# Patient Record
Sex: Female | Born: 1975 | Race: Black or African American | Hispanic: No | Marital: Single | Smoking: Never smoker
Health system: Southern US, Community
[De-identification: ages and names within clinical notes are randomized; demographics above are authoritative.]

## PROBLEM LIST (undated history)

## (undated) DIAGNOSIS — I10 Essential (primary) hypertension: Secondary | ICD-10-CM

## (undated) DIAGNOSIS — E119 Type 2 diabetes mellitus without complications: Secondary | ICD-10-CM

## (undated) DIAGNOSIS — D219 Benign neoplasm of connective and other soft tissue, unspecified: Secondary | ICD-10-CM

## (undated) DIAGNOSIS — D649 Anemia, unspecified: Secondary | ICD-10-CM

## (undated) HISTORY — PX: OTHER SURGICAL HISTORY: SHX169

## (undated) HISTORY — PX: BREAST LUMPECTOMY: SHX2

---

## 2019-02-12 ENCOUNTER — Emergency Department (HOSPITAL_COMMUNITY)
Admission: EM | Admit: 2019-02-12 | Discharge: 2019-02-12 | Disposition: A | Discharge status: Home / Self Care | Payer: Medicaid - Out of State | Attending: Emergency Medicine | Admitting: Emergency Medicine

## 2019-02-12 ENCOUNTER — Emergency Department (HOSPITAL_COMMUNITY): Payer: Medicaid - Out of State

## 2019-02-12 ENCOUNTER — Encounter (HOSPITAL_COMMUNITY): Payer: Self-pay | Admitting: *Deleted

## 2019-02-12 ENCOUNTER — Other Ambulatory Visit: Payer: Self-pay

## 2019-02-12 DIAGNOSIS — D649 Anemia, unspecified: Secondary | ICD-10-CM | POA: Diagnosis not present

## 2019-02-12 DIAGNOSIS — R102 Pelvic and perineal pain: Secondary | ICD-10-CM

## 2019-02-12 DIAGNOSIS — I10 Essential (primary) hypertension: Secondary | ICD-10-CM | POA: Diagnosis not present

## 2019-02-12 DIAGNOSIS — E119 Type 2 diabetes mellitus without complications: Secondary | ICD-10-CM | POA: Diagnosis not present

## 2019-02-12 DIAGNOSIS — R1031 Right lower quadrant pain: Secondary | ICD-10-CM | POA: Diagnosis present

## 2019-02-12 HISTORY — DX: Type 2 diabetes mellitus without complications: E11.9

## 2019-02-12 HISTORY — DX: Anemia, unspecified: D64.9

## 2019-02-12 HISTORY — DX: Essential (primary) hypertension: I10

## 2019-02-12 LAB — COMPREHENSIVE METABOLIC PANEL
ALT: 18 U/L (ref 0–44)
AST: 17 U/L (ref 15–41)
Albumin: 4.1 g/dL (ref 3.5–5.0)
Alkaline Phosphatase: 74 U/L (ref 38–126)
Anion gap: 9 (ref 5–15)
BUN: 9 mg/dL (ref 6–20)
CO2: 23 mmol/L (ref 22–32)
Calcium: 9.1 mg/dL (ref 8.9–10.3)
Chloride: 105 mmol/L (ref 98–111)
Creatinine, Ser: 0.7 mg/dL (ref 0.44–1.00)
GFR calc Af Amer: >60 mL/min (ref >60)
GFR calc non Af Amer: >60 mL/min (ref >60)
Glucose, Bld: 351 mg/dL — ABNORMAL HIGH (ref 70–99)
Potassium: 3.7 mmol/L (ref 3.5–5.1)
Sodium: 137 mmol/L (ref 135–145)
Total Bilirubin: 0.4 mg/dL (ref 0.3–1.2)
Total Protein: 7.9 g/dL (ref 6.5–8.1)

## 2019-02-12 LAB — URINALYSIS, ROUTINE W REFLEX MICROSCOPIC
BILIRUBIN URINE: NEGATIVE
Bacteria, UA: NONE SEEN
Glucose, UA: >=500 mg/dL — AB
Ketones, ur: 5 mg/dL — AB
Leukocytes,Ua: NEGATIVE
Nitrite: NEGATIVE
PH: 5 (ref 5.0–8.0)
Protein, ur: NEGATIVE mg/dL
Specific Gravity, Urine: 1.038 — ABNORMAL HIGH (ref 1.005–1.030)

## 2019-02-12 LAB — WET PREP, GENITAL
Clue Cells Wet Prep HPF POC: NONE SEEN
Sperm: NONE SEEN
Trich, Wet Prep: NONE SEEN
Yeast Wet Prep HPF POC: NONE SEEN

## 2019-02-12 LAB — I-STAT BETA HCG BLOOD, ED (MC, WL, AP ONLY)

## 2019-02-12 LAB — CBC
HCT: 33.9 % — ABNORMAL LOW (ref 36.0–46.0)
Hemoglobin: 10 g/dL — ABNORMAL LOW (ref 12.0–15.0)
MCH: 25.7 pg — ABNORMAL LOW (ref 26.0–34.0)
MCHC: 29.5 g/dL — ABNORMAL LOW (ref 30.0–36.0)
MCV: 87.1 fL (ref 80.0–100.0)
Platelets: 402 10*3/uL — ABNORMAL HIGH (ref 150–400)
RBC: 3.89 MIL/uL (ref 3.87–5.11)
RDW: 13.9 % (ref 11.5–15.5)
WBC: 10.3 10*3/uL (ref 4.0–10.5)
nRBC: 0 % (ref 0.0–0.2)

## 2019-02-12 LAB — LIPASE, BLOOD: Lipase: 42 U/L (ref 11–51)

## 2019-02-12 MED ORDER — ONDANSETRON HCL 4 MG/2ML IJ SOLN
4.0000 mg | Freq: Once | INTRAMUSCULAR | Status: AC
  Administered 2019-02-12: 4 mg via INTRAVENOUS
  Filled 2019-02-12: qty 2

## 2019-02-12 MED ORDER — SODIUM CHLORIDE 0.9% FLUSH
3.0000 mL | Freq: Once | INTRAVENOUS | Status: AC
  Administered 2019-02-12: 3 mL via INTRAVENOUS

## 2019-02-12 MED ORDER — SODIUM CHLORIDE 0.9 % IV BOLUS
1000.0000 mL | Freq: Once | INTRAVENOUS | Status: AC
  Administered 2019-02-12: 1000 mL via INTRAVENOUS

## 2019-02-12 MED ORDER — NAPROXEN 500 MG PO TABS: 500.0000 mg | ORAL_TABLET | Freq: Two times a day (BID) | ORAL | 0 refills | Status: AC

## 2019-02-12 MED ORDER — ONDANSETRON 4 MG PO TBDP: 4.0000 mg | ORAL_TABLET | Freq: Three times a day (TID) | ORAL | 0 refills | Status: AC | PRN

## 2019-02-12 MED ORDER — PROMETHAZINE HCL 25 MG/ML IJ SOLN
25.0000 mg | Freq: Once | INTRAMUSCULAR | Status: AC
  Administered 2019-02-12: 25 mg via INTRAVENOUS
  Filled 2019-02-12: qty 1

## 2019-02-12 MED ORDER — HYDROMORPHONE HCL 1 MG/ML IJ SOLN
1.0000 mg | Freq: Once | INTRAMUSCULAR | Status: AC
  Administered 2019-02-12: 1 mg via INTRAVENOUS
  Filled 2019-02-12: qty 1

## 2019-02-12 NOTE — ED Triage Notes (Signed)
Pt stated "I have fibroids but for the past days I've been having worse pain on the right side.  I feel weak.  I'm from [place] but down here visiting for the next couple of weeks."  Pt c/o nausea; denies vomiting.

## 2019-02-12 NOTE — ED Notes (Signed)
US at bedside

## 2019-02-12 NOTE — ED Notes (Signed)
Patient given diet ginger ale for fluid challenge. Pt states she still feels nauseated but is able to keep it down.

## 2019-02-12 NOTE — ED Provider Notes (Signed)
Physical Exam  BP 129/79 (BP Location: Left Arm)   Pulse 74   Temp 98.1 F (36.7 C) (Oral)   Resp 15   Ht 5\' 5"  (1.651 m)   Wt 95.3 kg   LMP 01/15/2019 (Approximate)   SpO2 100%   BMI 34.95 kg/m   Assumed care from [employer], PA-C at 0700. Briefly, the patient is a 43 y.o. female with PMHx of  has a past medical history of Anemia, Diabetes mellitus without complication ([place]), and Hypertension. here with pelvic and flank pain.  Patient has a known history of multiple fibroids as well as pelvic pain, however reports it is worse today.  Patient has been bleeding daily since February 1.  She typically experiences menses every 28 days with intermenstrual bleeding since September 2019.  Labs Reviewed  WET PREP, GENITAL - Abnormal; Notable for the following components:      Result Value   WBC, Wet Prep HPF POC FEW (*)    All other components within normal limits  COMPREHENSIVE METABOLIC PANEL - Abnormal; Notable for the following components:   Glucose, Bld 351 (*)    All other components within normal limits  CBC - Abnormal; Notable for the following components:   Hemoglobin 10.0 (*)    HCT 33.9 (*)    MCH 25.7 (*)    MCHC 29.5 (*)    Platelets 402 (*)    All other components within normal limits  URINALYSIS, ROUTINE W REFLEX MICROSCOPIC - Abnormal; Notable for the following components:   Color, Urine AMBER (*)    APPearance CLOUDY (*)    Specific Gravity, Urine 1.038 (*)    Glucose, UA >=500 (*)    Hgb urine dipstick MODERATE (*)    Ketones, ur 5 (*)    All other components within normal limits  LIPASE, BLOOD  I-STAT BETA HCG BLOOD, ED ([facility], [facility], [facility] ONLY)  I-STAT BETA HCG BLOOD, ED ([facility], [facility], [facility] ONLY)  GC/CHLAMYDIA PROBE AMP ([place]) NOT AT [facility]    Course of Care:   Physical Exam Vitals signs and nursing note reviewed.  Constitutional:      General: She is not in acute distress.    Appearance: She is well-developed. She is not diaphoretic.     Comments: Sitting  comfortably in bed.  HENT:     Head: Normocephalic and atraumatic.  Eyes:     General:        Right eye: No discharge.        Left eye: No discharge.     Conjunctiva/sclera: Conjunctivae normal.     Comments: EOMs normal to gross examination.  Neck:     Musculoskeletal: Normal range of motion.  Cardiovascular:     Rate and Rhythm: Normal rate and regular rhythm.     Comments: Intact, 2+ radial pulse. Abdominal:     General: There is no distension.     Tenderness: There is abdominal tenderness in the right lower quadrant and left lower quadrant.     Comments: Mild lower abdominal pain w/o guarding or rebound.   Musculoskeletal: Normal range of motion.  Skin:    General: Skin is warm and dry.  Neurological:     Mental Status: She is alert.     Comments: Cranial nerves intact to gross observation. Patient moves extremities without difficulty.  Psychiatric:        Behavior: Behavior normal.        Thought Content: Thought content normal.  Judgment: Judgment normal.     ED Course/Procedures     Procedures  MDM   9:00 AM On my exam, patient is comfortable, resting, and in no distress.  Abdomen is nonsurgical.  Provided patient passes p.o. challenge, patient stable for discharge.  No active vomiting currently.  Work-up demonstrating no leukocytosis.  Hemoglobin 10.0, approximately one-point lower in the past month.  Likely secondary to intermenstrual bleeding.  No electrolyte abnormalities.  Urinalysis demonstrating moderate hemoglobinuria, consistent with report of vaginal bleeding.  Wet prep with few WBCs, not consistent with STI.  CT renal stone study was without abnormality.  Normal appendix.  Patient tolerating p.o.  Patient follow-up with her OB/GYN later this week.  Return precautions given for any worsening pain or intractable nausea or vomiting.  Patient is in understanding and agrees with the plan of care.    Albesa Seen, PA-C 02/12/19 0945    Fatima Blank, MD 02/13/19 [phone]

## 2019-02-12 NOTE — ED Provider Notes (Signed)
[place] DEPT Provider Note   CSN: [id number] Arrival date & time: 02/12/19  0136    History   Chief Complaint Chief Complaint  Patient presents with  . Abdominal Pain    HPI Karley Pho is a 43 y.o. female with a h/o of IDDM T2, HTN, and iron deficiency anemia secondary to uterine fibroids who presents to the emergency department with a chief complaint of right lower quadrant pain.  The patient endorses gradual onset, constant right lower quadrant pain that is been worsening since onset 2 days ago.  States the pain is throbbing and worse with movement.  Pain is improved when she crawls up into the fetal position and does not move.  She states "it feels like there is a ball inside of me." She reports associated right flank pain, nausea, and nonbloody, nonbilious emesis.  She reports associated generalized weakness and fatigue.  She also reports that she has had vaginal bleeding since February 1.  She states this is not uncommon for her to bleed for several weeks due to her uterine fibroids.  She is currently established with OB/GYN for uterine fibroids.  She reports that she had to stop wearing pads because she was developing a rash from wearing them so frequently.  She denies fever, chills, vaginal discharge, vaginal pain or itching, dysuria, hematuria, chest pain, shortness of breath.  Reports that she is not currently sexually active and has not been sexually active in the last 6 months.  She is currently on the Depo.  She reports that she takes Colace daily for constipation.     The history is provided by the patient. No language interpreter was used.    Past Medical History:  Diagnosis Date  . Anemia   . Diabetes mellitus without complication ([place])   . Hypertension     There are no active problems to display for this patient.   Past Surgical History:  Procedure Laterality Date  . BREAST LUMPECTOMY     right  . tubal ligation        OB History   No obstetric history on file.      Home Medications    Prior to Admission medications   Not on File    Family History No family history on file.  Social History Social History   Tobacco Use  . Smoking status: Never Smoker  . Smokeless tobacco: Never Used  Substance Use Topics  . Alcohol use: Not Currently    Frequency: Never  . Drug use: Never     Allergies   Patient has no allergy information on record.   Review of Systems Review of Systems  Constitutional: Negative for activity change, chills and fever.  Eyes: Negative for visual disturbance.  Respiratory: Negative for shortness of breath.   Cardiovascular: Negative for chest pain.  Gastrointestinal: Positive for abdominal pain, nausea and vomiting. Negative for constipation and diarrhea.  Genitourinary: Positive for flank pain, menstrual problem, pelvic pain and vaginal bleeding. Negative for decreased urine volume, dysuria, hematuria, vaginal discharge and vaginal pain.  Musculoskeletal: Negative for back pain, myalgias, neck pain and neck stiffness.  Skin: Negative for rash.  Allergic/Immunologic: Negative for immunocompromised state.  Neurological: Negative for weakness and headaches.  Psychiatric/Behavioral: Negative for confusion.     Physical Exam Updated Vital Signs BP 129/79 (BP Location: Left Arm)   Pulse 74   Temp 98.1 F (36.7 C) (Oral)   Resp 15   Ht 5\' 5"  (1.651 m)  Wt 95.3 kg   LMP 01/15/2019 (Approximate)   SpO2 100%   BMI 34.95 kg/m   Physical Exam Vitals signs and nursing note reviewed.  Constitutional:      General: She is not in acute distress. HENT:     Head: Normocephalic.  Eyes:     Conjunctiva/sclera: Conjunctivae normal.  Neck:     Musculoskeletal: Neck supple.  Cardiovascular:     Rate and Rhythm: Normal rate and regular rhythm.     Pulses: Normal pulses.     Heart sounds: Normal heart sounds. No murmur. No friction rub. No gallop.    Pulmonary:     Effort: Pulmonary effort is normal. No respiratory distress.     Breath sounds: No stridor. No wheezing, rhonchi or rales.  Chest:     Chest wall: No tenderness.  Abdominal:     General: There is no distension.     Palpations: Abdomen is soft. There is no shifting dullness.     Tenderness: There is abdominal tenderness. There is right CVA tenderness and guarding. There is no left CVA tenderness or rebound.     Comments: Diffusely tender to palpation throughout the entire abdomen with maximal tenderness to palpation with guarding in the right lower quadrant.  No tenderness over McBurney's point.  She has right CVA tenderness.  No left CVA tenderness.  Negative Murphy sign.  No rebound.  Abdomen is soft, nondistended.  Genitourinary:    Comments: Chaperoned exam.  Diffuse adnexal tenderness bilaterally.  No masses bilaterally.  She has cervical motion tenderness.  No chandelier sign.  There is a scant amount of white discharge in the vaginal vault. Musculoskeletal:     Right lower leg: No edema.     Left lower leg: No edema.  Skin:    General: Skin is warm.     Findings: No rash.  Neurological:     Mental Status: She is alert.  Psychiatric:        Behavior: Behavior normal.      ED Treatments / Results  Labs (all labs ordered are listed, but only abnormal results are displayed) Labs Reviewed  WET PREP, GENITAL - Abnormal; Notable for the following components:      Result Value   WBC, Wet Prep HPF POC FEW (*)    All other components within normal limits  COMPREHENSIVE METABOLIC PANEL - Abnormal; Notable for the following components:   Glucose, Bld 351 (*)    All other components within normal limits  CBC - Abnormal; Notable for the following components:   Hemoglobin 10.0 (*)    HCT 33.9 (*)    MCH 25.7 (*)    MCHC 29.5 (*)    Platelets 402 (*)    All other components within normal limits  URINALYSIS, ROUTINE W REFLEX MICROSCOPIC - Abnormal; Notable for the  following components:   Color, Urine AMBER (*)    APPearance CLOUDY (*)    Specific Gravity, Urine 1.038 (*)    Glucose, UA >=500 (*)    Hgb urine dipstick MODERATE (*)    Ketones, ur 5 (*)    All other components within normal limits  LIPASE, BLOOD  I-STAT BETA HCG BLOOD, ED ([facility], [facility], [facility] ONLY)  I-STAT BETA HCG BLOOD, ED ([facility], [facility], [facility] ONLY)  GC/CHLAMYDIA PROBE AMP ([place]) NOT AT [facility]    EKG None  Radiology Ct Renal Stone Study  Result Date: 02/12/2019 CLINICAL DATA:  43 year old female with flank pain. Concern for kidney stone. EXAM:  CT ABDOMEN AND PELVIS WITHOUT CONTRAST TECHNIQUE: Multidetector CT imaging of the abdomen and pelvis was performed following the standard protocol without IV contrast. COMPARISON:  None. FINDINGS: Evaluation of this exam is limited in the absence of intravenous contrast. Lower chest: The visualized lung bases are clear. No intra-abdominal free air or free fluid. Hepatobiliary: No focal liver abnormality is seen. No gallstones, gallbladder wall thickening, or biliary dilatation. Pancreas: Unremarkable. No pancreatic ductal dilatation or surrounding inflammatory changes. Spleen: Normal in size without focal abnormality. Adrenals/Urinary Tract: Adrenal glands are unremarkable. Kidneys are normal, without renal calculi, focal lesion, or hydronephrosis. Bladder is unremarkable. Stomach/Bowel: There is large amount of stool throughout the colon. No bowel obstruction or active inflammation. Normal appendix. Vascular/Lymphatic: The abdominal aorta and IVC are grossly unremarkable on this noncontrast CT. No portal venous gas. There is no adenopathy. Reproductive: Enlarged myomatous uterus. Other: None Musculoskeletal: No acute or significant osseous findings. IMPRESSION: 1. No acute intra-abdominal or pelvic pathology. No hydronephrosis or nephrolithiasis. 2. Constipation. No bowel obstruction or active inflammation. Normal appendix. 3. Enlarged myomatous uterus.  Electronically Signed   By: Anner Crete M.D.   On: 02/12/2019 05:03    Procedures Procedures (including critical care time)  Medications Ordered in ED Medications  sodium chloride flush (NS) 0.9 % injection 3 mL (3 mLs Intravenous Given 02/12/19 0751)  sodium chloride 0.9 % bolus 1,000 mL (0 mLs Intravenous Stopped 02/12/19 0531)  ondansetron (ZOFRAN) injection 4 mg (4 mg Intravenous Given 02/12/19 0401)  HYDROmorphone (DILAUDID) injection 1 mg (1 mg Intravenous Given 02/12/19 0402)  promethazine (PHENERGAN) injection 25 mg (25 mg Intravenous Given 02/12/19 0750)  sodium chloride 0.9 % bolus 1,000 mL (1,000 mLs Intravenous Bolus 02/12/19 0749)     Initial Impression / Assessment and Plan / ED Course  I have reviewed the triage vital signs and the nursing notes.  Pertinent labs & imaging results that were available during my care of the patient were reviewed by me and considered in my medical decision making (see chart for details).        43 year old female with a h/o of IDDM T2, HTN, and iron deficiency anemia secondary to uterine fibroids presenting with right lower quadrant pain for 2 days with nausea and vomiting.  She has a history of uterine fibroids and has had vaginal bleeding for the last month.  She is currently on the Depo-Provera injection.  She has a history of a tubal ligation.  On exam, she is tender to palpation the bilateral lower quadrants no tenderness over McBurney's point.  She appears uncomfortable.  Dilaudid given for pain control with Zofran for nausea.  She was given a fluid bolus for hyperglycemia.  Anion gap and bicarb are normal.  Hemoglobin is 10.0, down from 11.5 last month.  UA with glucosuria, mild ketonuria, and elevated specific gravity as well as RBCs and moderate hematuria.  Although hemoglobinuria is likely secondary to vaginal bleeding, given her presentation and history of present illness, will order CT stone study.  CT stone study with constipation,  but no bowel obstruction or active inflammation.  It demonstrates an enlarged myomatous uterus.  No acute intra-abdominal pelvic pathology.  No nephrolithiasis.  Discussed these findings with the patient.  Pelvic exam was form for completeness.  Wet prep with few WBCs but otherwise unremarkable.  She attempted fluid challenge, but developed vomiting so Reglan was given.  On reexamination of her abdomen, she continues to have discomfort with palpation of the bilateral lower quadrants.  Will  order pelvic ultrasound since she was diffusely tender to palpation on pelvic exam.  Low suspicion for PID, ovarian torsion, diverticulitis, appendicitis, nephrolithiasis, pancreatitis, or cholecystitis.  Patient care transferred to [place] at the end of my shift.  I suspect her symptoms may be secondary to uterine fibroids coupled with constipation.  patient presentation, ED course, and plan of care discussed with review of all pertinent labs and imaging.  Suspect she will likely be appropriate for discharge to home after work-up is complete.  Please see his/her note for further details regarding further ED course and disposition.   Final Clinical Impressions(s) / ED Diagnoses   Final diagnoses:  None    ED Discharge Orders    None       Joanne Gavel, PA-C 02/12/19 0810    Fatima Blank, MD 02/13/19 [phone]

## 2019-02-12 NOTE — ED Notes (Signed)
Bed: WA25 Expected date:  Expected time:  Means of arrival:  Comments: Triage 2 

## 2019-02-12 NOTE — ED Notes (Signed)
Pt provided with gingerale by PA

## 2019-02-12 NOTE — Discharge Instructions (Addendum)
Please see the information and instructions below regarding your visit.  Your diagnoses today include:  1. Pelvic pain   2. Anemia, unspecified type     Tests performed today include: See side panel of your discharge paperwork for testing performed today. Vital signs are listed at the bottom of these instructions.   Your work-up is very reassuring today.    Medications prescribed:    Take any prescribed medications only as prescribed, and any over the counter medications only as directed on the packaging.  You are prescribed naproxen, a non-steroidal anti-inflammatory agent (NSAID) for pain. You may take 500 mg every 12 hours as needed for pain. If still requiring this medication around the clock for acute pain after 10 days, please see your primary healthcare provider.  Women who are pregnant, breastfeeding, or planning on becoming pregnant should not take non-steroidal anti-inflammatories such as Advil and Aleve. Tylenol is a safe over the counter pain reliever in pregnant women.  You may combine this medication with Tylenol, 650 mg every 6 hours, so you are receiving something for pain every 3 hours.  This is not a long-term medication unless under the care and direction of your primary provider. Taking this medication long-term and not under the supervision of a healthcare provider could increase the risk of stomach ulcers, kidney problems, and cardiovascular problems such as high blood pressure.   Please take Zofran under the tongue every 8 hours as needed for nausea and vomiting.  Home care instructions:  Please follow any educational materials contained in this packet.   Follow-up instructions:  Please follow up with you OBGYN in the coming week for pelvic pain.  Return instructions:  Please return to the Emergency Department if you experience worsening symptoms.  Please return to the emergency department immediately if you have any worsening pain, pain in the prevention  keep anything down by mouth, or any new or worsening symptoms. Please return if you have any other emergent concerns.  Additional Information:   Your vital signs today were: BP 129/79 (BP Location: Left Arm)    Pulse 74    Temp 98.1 F (36.7 C) (Oral)    Resp 15    Ht 5\' 5"  (1.651 m)    Wt 95.3 kg    LMP 01/15/2019 (Approximate)    SpO2 100%    BMI 34.95 kg/m  If your blood pressure (BP) was elevated on multiple readings during this visit above 130 for the top number or above 80 for the bottom number, please have this repeated by your primary care provider within one month. --------------  Thank you for allowing [place] to participate in your care today.

## 2019-02-12 NOTE — ED Notes (Signed)
Pt tolerating fluids at this time.  

## 2019-02-14 LAB — GC/CHLAMYDIA PROBE AMP (~~LOC~~) NOT AT ARMC
Chlamydia: NEGATIVE
Neisseria Gonorrhea: NEGATIVE

## 2019-08-02 ENCOUNTER — Encounter (HOSPITAL_COMMUNITY): Payer: Self-pay

## 2019-08-02 ENCOUNTER — Ambulatory Visit (HOSPITAL_COMMUNITY)
Admission: EM | Admit: 2019-08-02 | Discharge: 2019-08-02 | Disposition: A | Discharge status: Home / Self Care | Payer: BLUE CROSS/BLUE SHIELD | Attending: Family Medicine | Admitting: Family Medicine

## 2019-08-02 ENCOUNTER — Other Ambulatory Visit: Payer: Self-pay

## 2019-08-02 DIAGNOSIS — R6889 Other general symptoms and signs: Secondary | ICD-10-CM | POA: Diagnosis present

## 2019-08-02 DIAGNOSIS — Z20822 Contact with and (suspected) exposure to covid-19: Secondary | ICD-10-CM

## 2019-08-02 DIAGNOSIS — Z20828 Contact with and (suspected) exposure to other viral communicable diseases: Secondary | ICD-10-CM | POA: Diagnosis not present

## 2019-08-02 DIAGNOSIS — R51 Headache: Secondary | ICD-10-CM

## 2019-08-02 DIAGNOSIS — R519 Headache, unspecified: Secondary | ICD-10-CM

## 2019-08-02 HISTORY — DX: Benign neoplasm of connective and other soft tissue, unspecified: D21.9

## 2019-08-02 NOTE — ED Provider Notes (Signed)
[facility]    CSN: [id number] Arrival date & time: 08/02/19  1056     History   Chief Complaint Chief Complaint  Patient presents with  . Headache    HPI Kimberly Parrish is a 43 y.o. female.   Patient presents with headache x3 days.  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  Her headache is generalized across the top of her head and forehead.  She denies vision changes, dizziness, palpitations, fever, chills, sore throat, chest pain, cough, shortness of breath, vomiting, diarrhea, or other symptoms.  She has taken Tylenol and ibuprofen without relief but states her headache is improved with rest.  The history is provided by the patient.    Past Medical History:  Diagnosis Date  . Anemia   . Diabetes mellitus without complication ([place])   . Fibroids   . Hypertension     There are no active problems to display for this patient.   Past Surgical History:  Procedure Laterality Date  . BREAST LUMPECTOMY     right  . tubal ligation      OB History   No obstetric history on file.      Home Medications    Prior to Admission medications   Medication Sig Start Date End Date Taking? Authorizing Provider  aspirin EC 81 MG tablet Take 81 mg by mouth daily. 01/04/19   [provider]  atorvastatin (LIPITOR) 20 MG tablet Take 20 mg by mouth daily. 12/30/18   [provider]  B Complex-C-Folic Acid (RENAL) 1 MG CAPS Take 1 capsule by mouth every morning.    [provider]  DOK 100 MG capsule Take 100 mg by mouth daily. 01/21/19   [provider]  Ferrous Sulfate (IRON) 325 (65 Fe) MG TABS Take 65 mg by mouth daily. 01/21/19   [provider]  gabapentin (NEURONTIN) 400 MG capsule Take 400 mg by mouth 3 (three) times daily. 01/21/19   [provider]  LANTUS SOLOSTAR 100 UNIT/ML Solostar Pen Inject 30 Units into the skin at bedtime.  01/27/19   [provider]  lisinopril (PRINIVIL,ZESTRIL) 20 MG tablet Take 20 mg by mouth daily. 12/30/18   [provider]  metFORMIN (GLUCOPHAGE) 1000 MG tablet Take 1,000 mg by mouth 2 (two) times daily. 01/04/19   [provider]  NOVOLOG FLEXPEN 100 UNIT/ML FlexPen Inject 20 Units into the skin 2 (two) times daily. 01/27/19   [provider]  ondansetron (ZOFRAN ODT) 4 MG disintegrating tablet Take 1 tablet (4 mg total) by mouth every 8 (eight) hours as needed for nausea or vomiting. 02/12/19   Albesa Seen, PA-C    Family History Family History  Problem Relation Age of Onset  . Diabetes Mother   . Multiple sclerosis Mother     Social History Social History   Tobacco Use  . Smoking status: Never Smoker  . Smokeless tobacco: Never Used  Substance Use Topics  . Alcohol use: Not Currently    Frequency: Never  . Drug use: Never     Allergies   Metronidazole   Review of Systems Review of Systems  Constitutional: Negative for chills and fever.  HENT: Negative for congestion, ear pain, rhinorrhea, sore throat and trouble swallowing.   Eyes: Negative for pain and visual disturbance.  Respiratory: Negative for cough and shortness of breath.   Cardiovascular: Negative for chest pain  and palpitations.  Gastrointestinal: Negative for abdominal pain, diarrhea and vomiting.  Genitourinary: Negative for dysuria and hematuria.  Musculoskeletal: Negative for arthralgias and back pain.  Skin: Negative for color change and rash.  Neurological: Positive for headaches. Negative for dizziness, tremors, seizures, syncope, facial asymmetry, speech difficulty, weakness, light-headedness and numbness.  All other systems reviewed and are negative.    Physical Exam Triage Vital Signs ED Triage Vitals  Enc Vitals Group     BP 08/02/19 1131 126/87     Pulse Rate 08/02/19 1131 90     Resp 08/02/19 1131 16     Temp 08/02/19 1131 98.5 F (36.9 C)      Temp src --      SpO2 08/02/19 1131 99 %     Weight --      Height --      Head Circumference --      Peak Flow --      Pain Score 08/02/19 1126 8     Pain Loc --      Pain Edu? --      Excl. in [place]? --    No data found.  Updated Vital Signs BP 126/87 (BP Location: Left Arm)   Pulse 90   Temp 98.5 F (36.9 C)   Resp 16   LMP 07/26/2019   SpO2 99%   Visual Acuity Right Eye Distance:   Left Eye Distance:   Bilateral Distance:    Right Eye Near:   Left Eye Near:    Bilateral Near:     Physical Exam Vitals signs and nursing note reviewed.  Constitutional:      General: She is not in acute distress.    Appearance: She is well-developed.  HENT:     Head: Normocephalic and atraumatic.     Right Ear: Tympanic membrane normal.     Left Ear: Tympanic membrane normal.     Nose: Nose normal.     Mouth/Throat:     Mouth: Mucous membranes are moist.     Pharynx: Oropharynx is clear.  Eyes:     Conjunctiva/sclera: Conjunctivae normal.  Neck:     Musculoskeletal: Neck supple.  Cardiovascular:     Rate and Rhythm: Normal rate and regular rhythm.     Heart sounds: Normal heart sounds. No murmur.  Pulmonary:     Effort: Pulmonary effort is normal. No respiratory distress.     Breath sounds: Normal breath sounds.  Abdominal:     General: Bowel sounds are normal.     Palpations: Abdomen is soft.     Tenderness: There is no abdominal tenderness. There is no guarding or rebound.  Skin:    General: Skin is warm and dry.     Findings: No rash.  Neurological:     General: No focal deficit present.     Mental Status: She is alert and oriented to person, place, and time.     Cranial Nerves: No cranial nerve deficit.     Sensory: No sensory deficit.     Motor: No weakness.     Coordination: Coordination normal.     Gait: Gait normal.     Deep Tendon Reflexes: Reflexes normal.  Psychiatric:        Mood and Affect: Mood normal.        Behavior: Behavior normal.      UC  Treatments / Results  Labs (all labs ordered are listed, but only abnormal results are displayed) Labs Reviewed  NOVEL CORONAVIRUS,  NAA (HOSPITAL ORDER, SEND-OUT TO REF LAB)    EKG   Radiology No results found.  Procedures Procedures (including critical care time)  Medications Ordered in UC Medications - No data to display  Initial Impression / Assessment and Plan / UC Course  I have reviewed the triage vital signs and the nursing notes.  Pertinent labs & imaging results that were available during my care of the patient were reviewed by me and considered in my medical decision making (see chart for details).   Headache, suspect COVID.  Patient is well-appearing and her exam is unremarkable.  COVID test performed here.  Instructed patient to self quarantine until her COVID test result is back.  Instructed patient to go to the emergency department if she develops worsening headache, vision changes, shortness of breath, high fever, severe diarrhea, or other concerning symptoms.     Final Clinical Impressions(s) / UC Diagnoses   Final diagnoses:  Acute nonintractable headache, unspecified headache type  Suspected Covid-19 Virus Infection     Discharge Instructions     Your COVID test is pending.  You should self quarantine until your test result is back and is negative.    Go to the emergency department if you develop worsening headache, vision changes, shortness of breath, high fever, severe diarrhea, or other concerning symptoms.         ED Prescriptions    None     Controlled Substance Prescriptions [place] Controlled Substance Registry consulted? Not Applicable   Sharion Balloon, NP 08/02/19 1220

## 2019-08-02 NOTE — ED Triage Notes (Addendum)
Pt presents to UC with headache for 3 days. Pt states she had exposure to a coworker tested positive for COVID. Pt states she was taking motrin and tylenol without relief.

## 2019-08-02 NOTE — Discharge Instructions (Addendum)
Your COVID test is pending.  You should self quarantine until your test result is back and is negative.    Go to the emergency department if you develop worsening headache, vision changes, shortness of breath, high fever, severe diarrhea, or other concerning symptoms.

## 2019-08-03 LAB — NOVEL CORONAVIRUS, NAA (HOSP ORDER, SEND-OUT TO REF LAB; TAT 18-24 HRS): SARS-CoV-2, NAA: NOT DETECTED

## 2020-10-03 IMAGING — US US TRANSVAGINAL NON-OB
1 series · 14 of 25 positions shown · non-contrast
Comparison: CT from earlier in the same day.

CLINICAL DATA: Vaginal bleeding for 1 month with pelvic pain



[Series 1: us transvaginal non-ob · 14 of 121 slices shown]
[im 1/121]
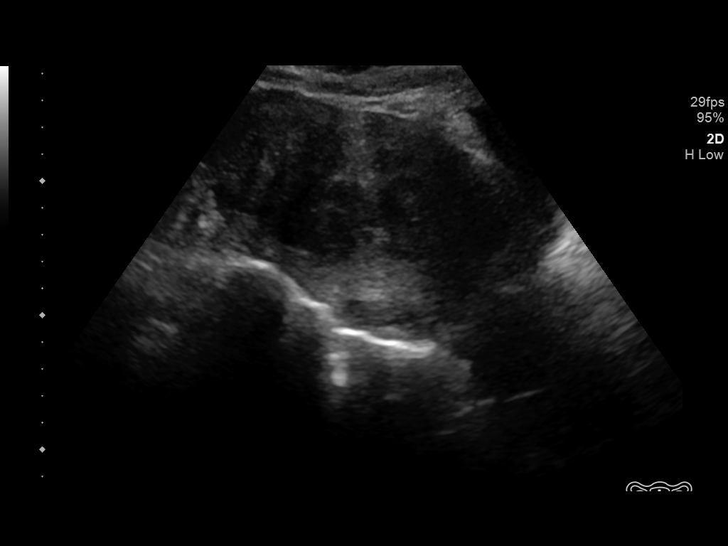
[im 11/121]
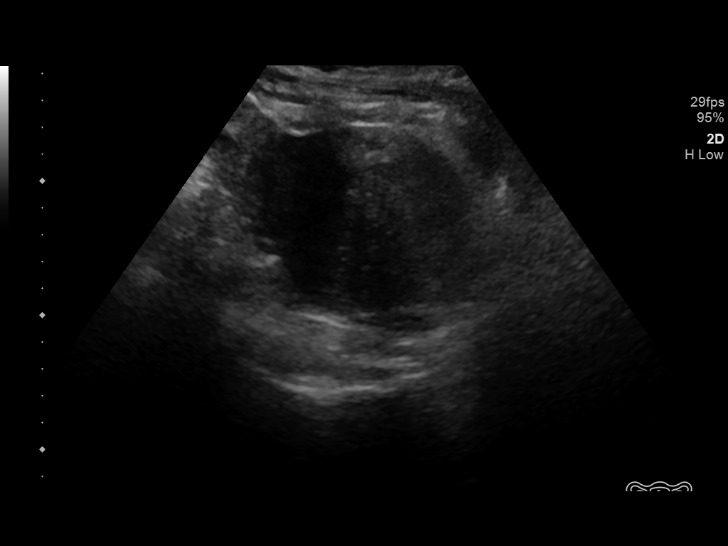
[im 21/121]
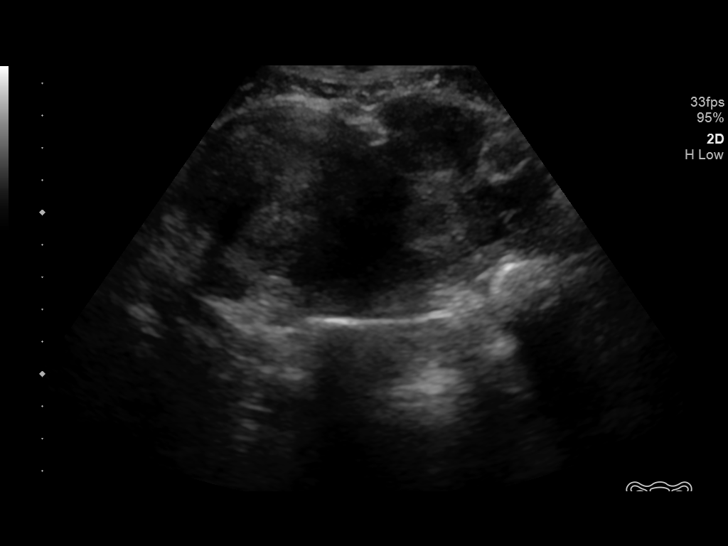
[im 31/121]
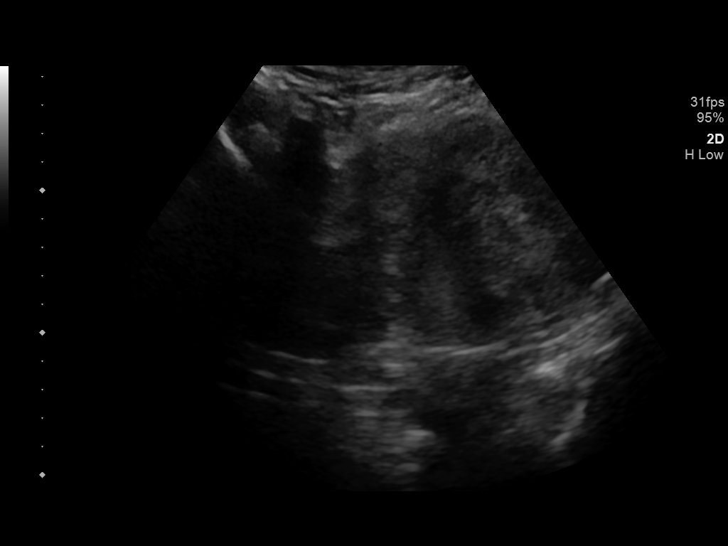
[im 41/121]
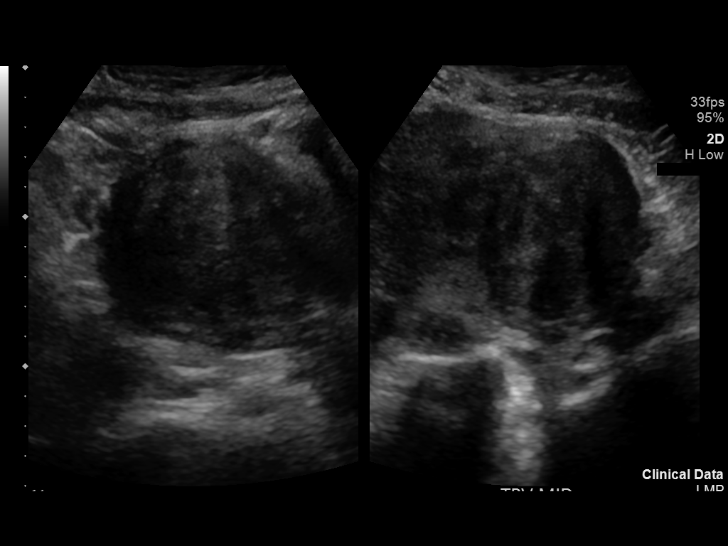
[im 46/121]
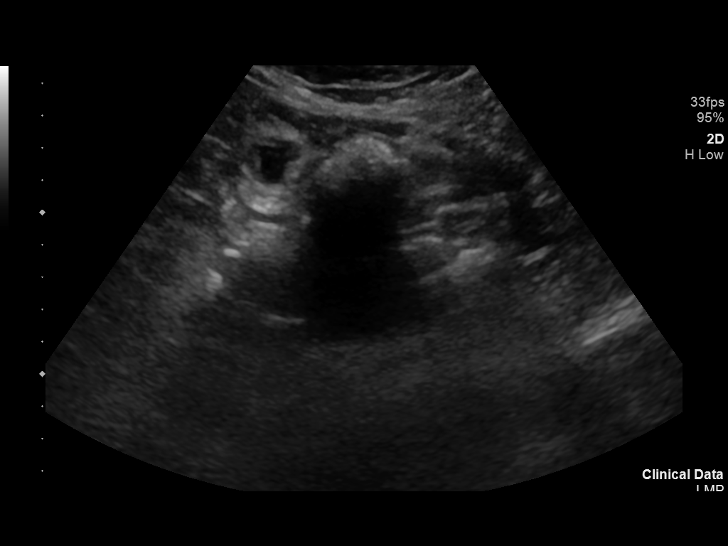
[im 56/121]
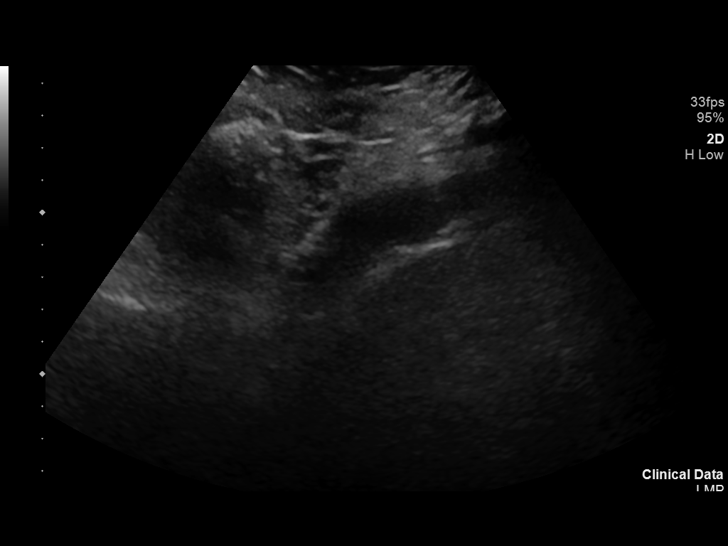
[im 66/121]
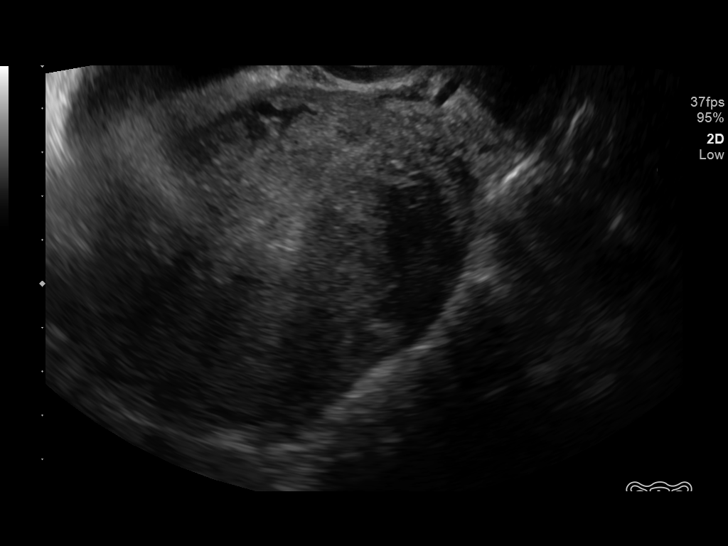
[im 76/121]
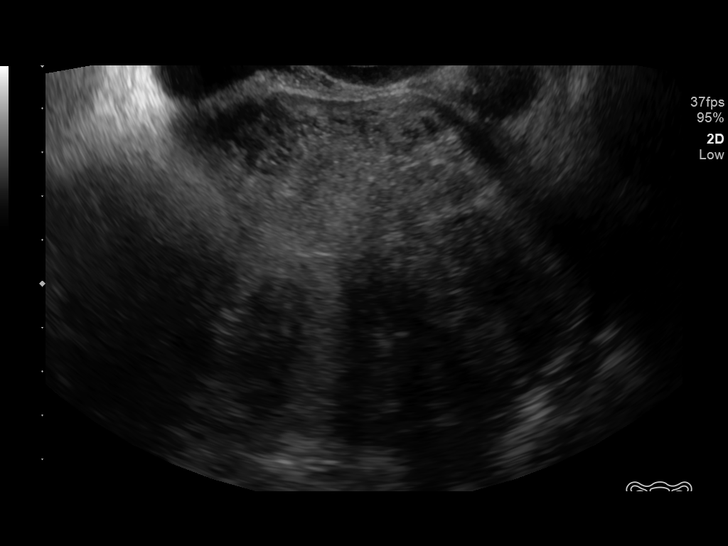
[im 81/121]
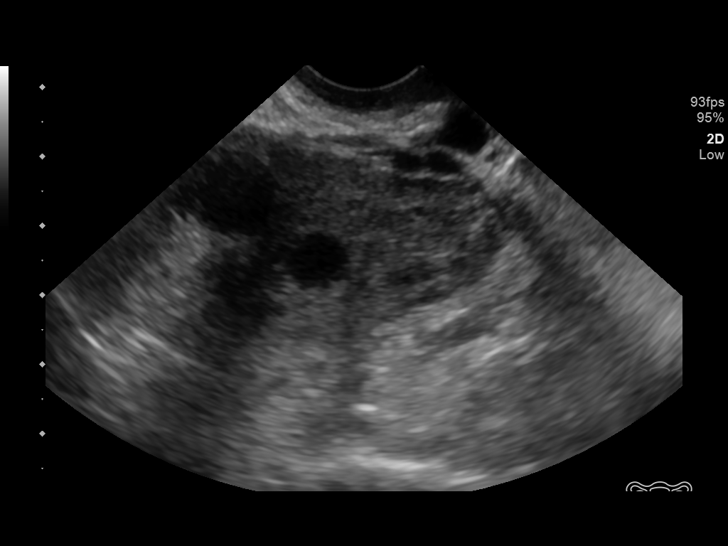
[im 91/121]
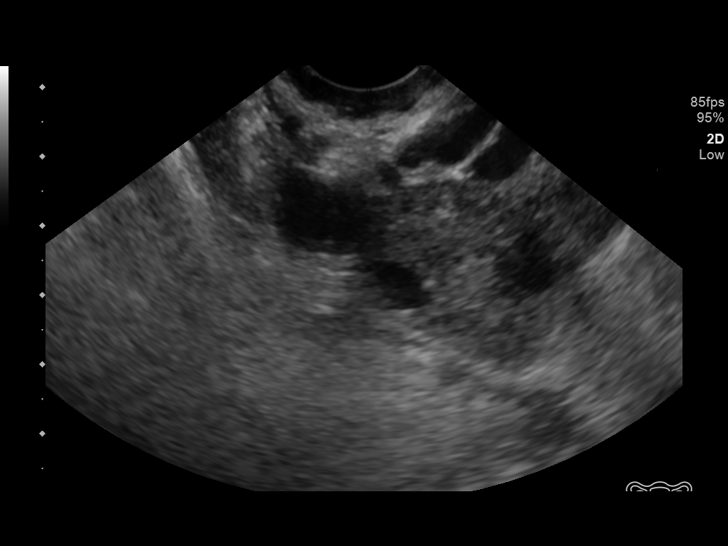
[im 101/121]
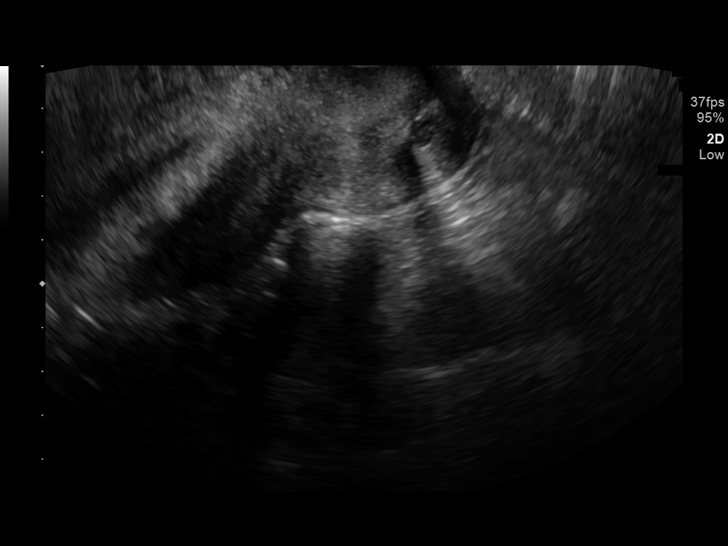
[im 111/121]
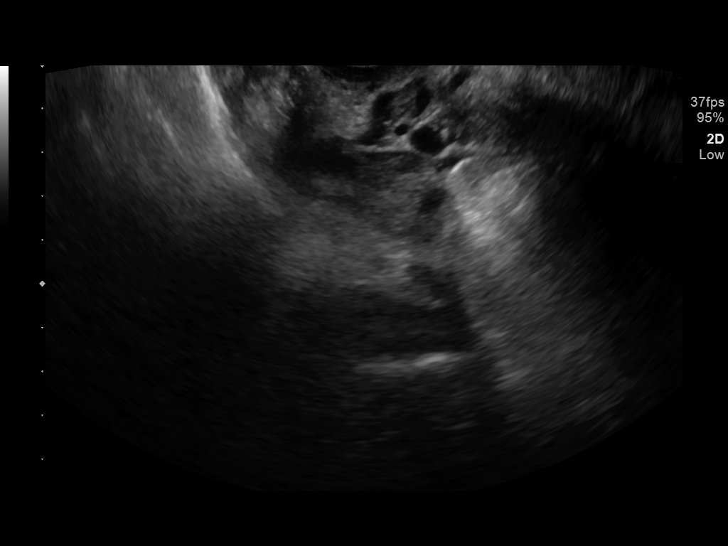
[im 121/121]
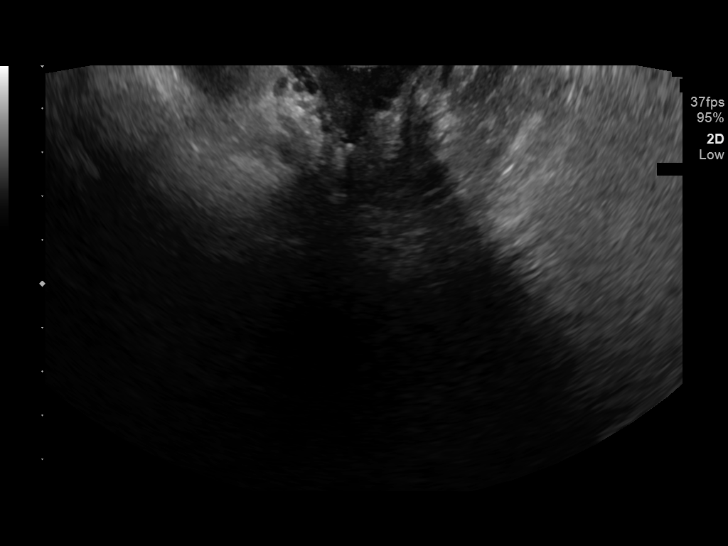

[14 of 25 positions shown; findings below may reference images not displayed]

FINDINGS: Uterus

Measurements: 16 x 7 x 11 cm = volume: 667 mL. Multiple uterine
fibroids are noted. The largest of these measures 7.1 cm anteriorly
within the uterus. These changes are similar to that seen on recent
CT examination.

Endometrium

Not visualized

Right ovary

Measurements: 4.5 x 2.4 x 2.6 cm = volume: 15.2 mL. Normal
appearance/no adnexal mass.

Left ovary

Not visualized

Other findings

No abnormal free fluid.
IMPRESSION: Changes consistent with fibroid uterus. No acute pelvic abnormality
is noted.
